# Patient Record
Sex: Male | Born: 1975 | Race: Black or African American | Hispanic: No | Smoking: Never smoker
Health system: Southern US, Community
[De-identification: ages and names within clinical notes are randomized; demographics above are authoritative.]

## PROBLEM LIST (undated history)

## (undated) DIAGNOSIS — R7303 Prediabetes: Secondary | ICD-10-CM

## (undated) HISTORY — PX: DENTAL SURGERY: SHX609

---

## 2007-10-19 ENCOUNTER — Ambulatory Visit: Payer: Self-pay | Admitting: General Practice

## 2009-03-24 IMAGING — CR DG CHEST 2V
1 series · 2 of 2 positions shown · non-contrast
Comparison: none

REASON FOR EXAM: 2 views [REDACTED] - FAX [phone]-Occupational
Health
COMMENTS:

PROCEDURE:     DXR - DXR CHEST PA (OR AP) AND LATERAL  - October 19, 2007  [DATE]
RESULT:     The lung fields are clear. The heart, mediastinal and osseous
structures show no acute changes. Incidental note is made of a slight
thoracolumbar scoliosis.

[Series 1: view not recorded · 0.17mm/px · 2 of 2 slices shown]
[im 1/2]
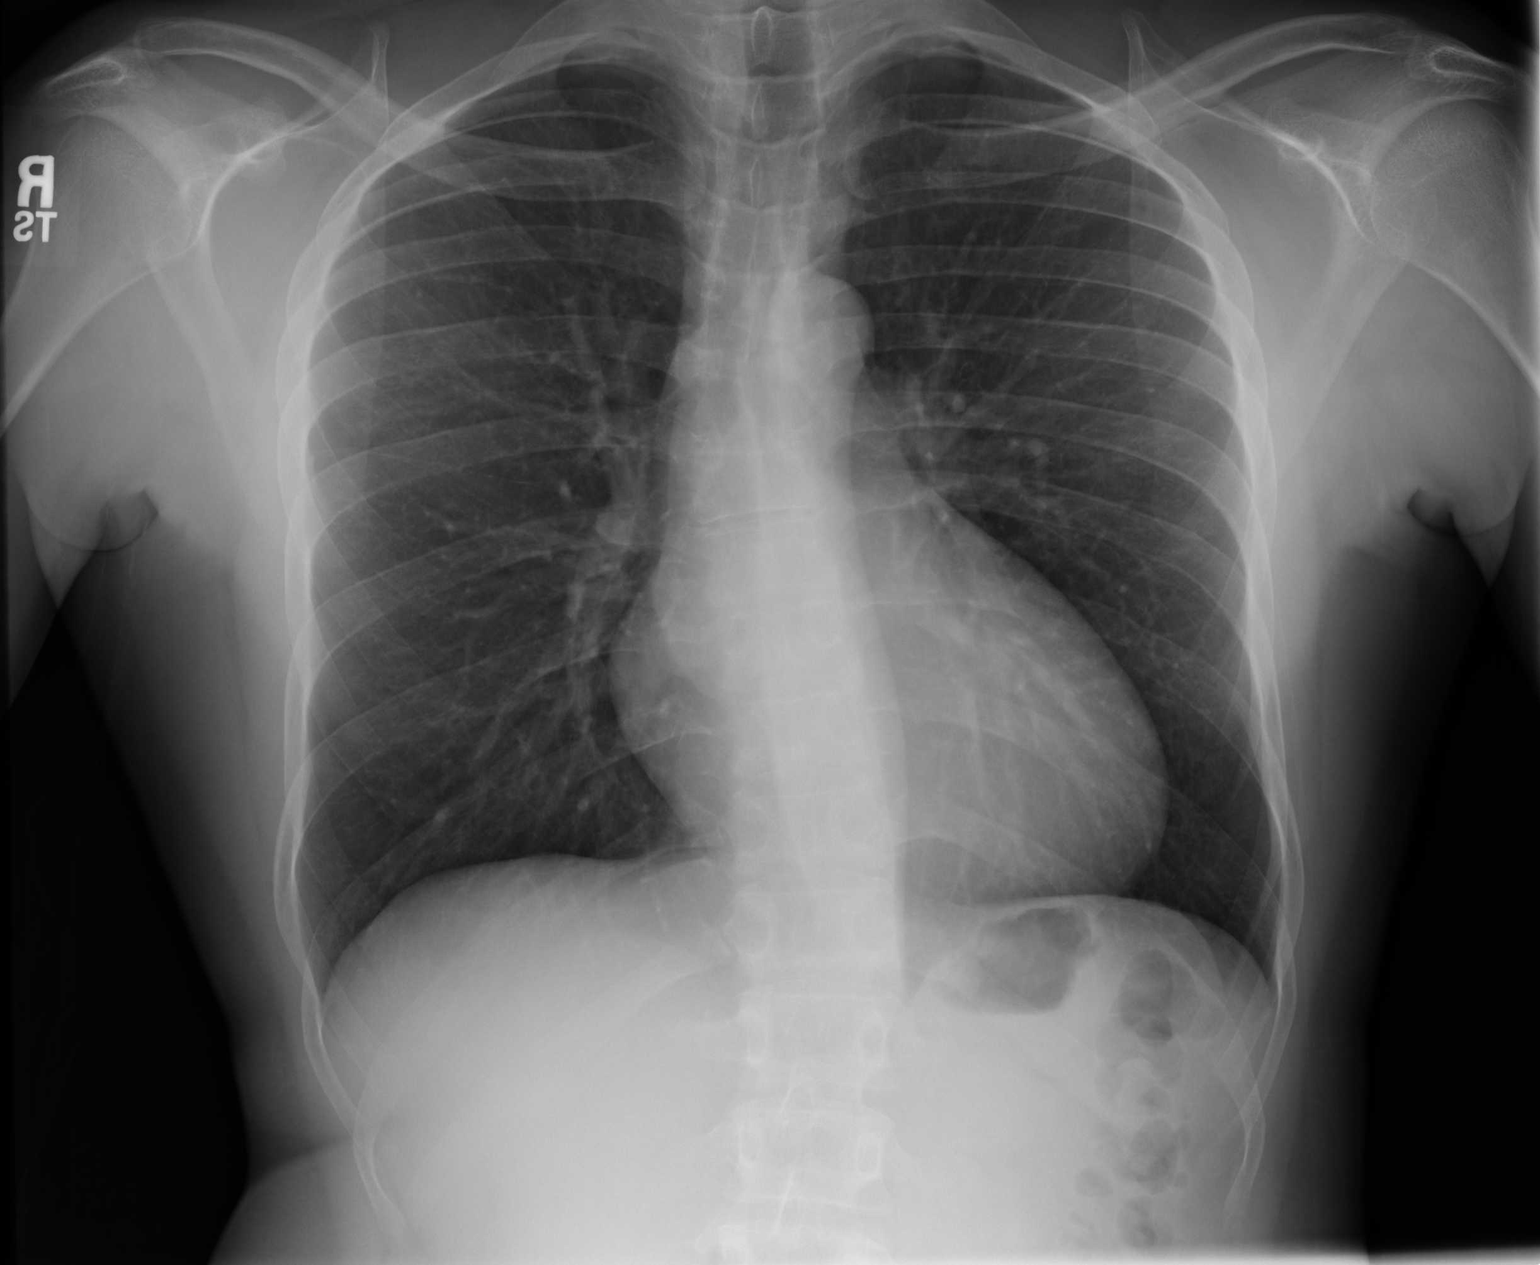
[im 2/2]
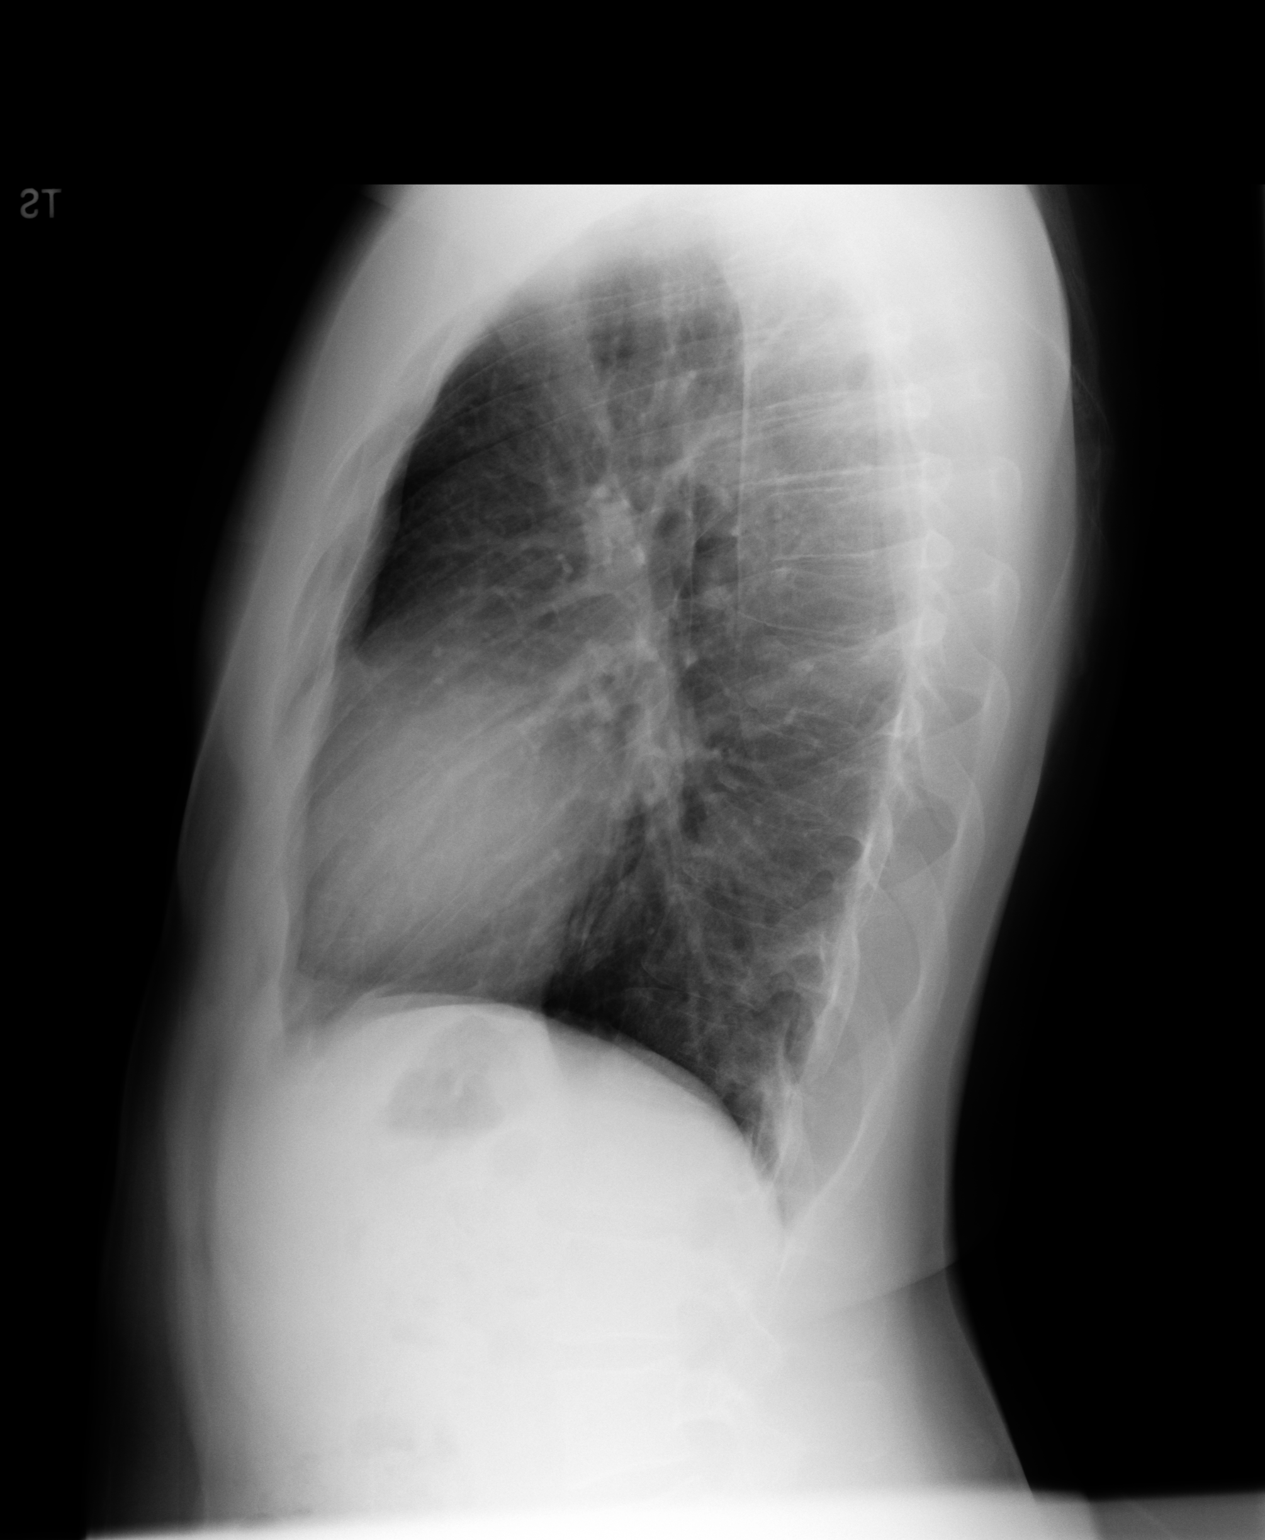

[2 of 2 positions shown; findings below may reference images not displayed]

IMPRESSION: 1. The lung fields are clear.
2. There is a slight thoracolumbar scoliosis.

## 2011-05-01 ENCOUNTER — Observation Stay: Payer: Self-pay | Admitting: Student

## 2011-05-24 ENCOUNTER — Ambulatory Visit: Payer: Self-pay | Admitting: Family Medicine

## 2011-06-18 ENCOUNTER — Ambulatory Visit: Payer: Self-pay | Admitting: Family Medicine

## 2011-07-19 ENCOUNTER — Ambulatory Visit: Payer: Self-pay | Admitting: Family Medicine

## 2020-09-03 ENCOUNTER — Encounter: Payer: Self-pay | Admitting: Emergency Medicine

## 2020-09-03 ENCOUNTER — Other Ambulatory Visit: Payer: Self-pay

## 2020-09-03 ENCOUNTER — Ambulatory Visit
Admission: EM | Admit: 2020-09-03 | Discharge: 2020-09-03 | Disposition: A | Discharge status: Other Acute Inpatient Hospital | Payer: Self-pay | Attending: Sports Medicine | Admitting: Sports Medicine

## 2020-09-03 DIAGNOSIS — IMO0002 Reserved for concepts with insufficient information to code with codable children: Secondary | ICD-10-CM

## 2020-09-03 DIAGNOSIS — R739 Hyperglycemia, unspecified: Secondary | ICD-10-CM | POA: Insufficient documentation

## 2020-09-03 DIAGNOSIS — E860 Dehydration: Secondary | ICD-10-CM | POA: Insufficient documentation

## 2020-09-03 DIAGNOSIS — R3589 Other polyuria: Secondary | ICD-10-CM | POA: Insufficient documentation

## 2020-09-03 DIAGNOSIS — R634 Abnormal weight loss: Secondary | ICD-10-CM | POA: Insufficient documentation

## 2020-09-03 DIAGNOSIS — E1065 Type 1 diabetes mellitus with hyperglycemia: Secondary | ICD-10-CM | POA: Insufficient documentation

## 2020-09-03 DIAGNOSIS — R631 Polydipsia: Secondary | ICD-10-CM | POA: Insufficient documentation

## 2020-09-03 HISTORY — DX: Prediabetes: R73.03

## 2020-09-03 LAB — URINALYSIS, COMPLETE (UACMP) WITH MICROSCOPIC
Bacteria, UA: NONE SEEN
Bilirubin Urine: NEGATIVE
Glucose, UA: >1000 mg/dL — AB
Ketones, ur: 80 mg/dL — AB
Leukocytes,Ua: NEGATIVE
Nitrite: NEGATIVE
Protein, ur: NEGATIVE mg/dL
Specific Gravity, Urine: 1.02 (ref 1.005–1.030)
Squamous Epithelial / LPF: NONE SEEN (ref 0–5)
pH: 5 (ref 5.0–8.0)

## 2020-09-03 LAB — GLUCOSE, CAPILLARY: Glucose-Capillary: 504 mg/dL (ref 70–99)

## 2020-09-03 NOTE — Discharge Instructions (Addendum)
Your blood sugar is 504.  This is consistent with diabetes.  You need to go to the emergency room.  Since her sister is driving, you do not need to go by EMS.  You will need IV insulin and IV fluids and correct any potential gap and acidosis that you have.  Please go directly to [facility] ED.  I have called and they are expecting you.

## 2020-09-03 NOTE — ED Triage Notes (Signed)
Patient in today c/o fatigue, dry mouth x 4 days. Patient states he has been told he was pre-diabetic. Patient states had cramping in his feet (L>R) Monday night (08/28/20). Patient went and got some Pedialyte and drank the whole bottle then cramping resolved. Patient states he is urinating more, but has been drinking a lot more too. Patient states even when he drinks a lot his mouth continues to be dry.

## 2020-09-03 NOTE — ED Triage Notes (Signed)
Patient is being discharged from the Urgent Care and sent to the Emergency Department via POV (with family) . Per Dr. Zachery Dauer, patient is in need of higher level of care due to elevated blood sugar. Patient is aware and verbalizes understanding of plan of care.  Vitals:   09/03/20 1347  BP: (!) 112/94  Pulse: 82  Resp: 18  Temp: 98.3 F (36.8 C)  SpO2: 96%

## 2020-09-07 NOTE — ED Provider Notes (Signed)
[facility]    CSN: [id number] Arrival date & time: 09/03/20  1311      History   Chief Complaint Chief Complaint  Patient presents with  . Fatigue  . dry mouth    HPI Martin Dudley is a 45 y.o. male.   Patient is a pleasant 45 year old male who presents for evaluation of the above issues.  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  On further history it appears as though he has been told that he had prediabetes years ago but never really had it worked up.  He reports that he lost his insurance for a while but recently was hired by [employer].  He reports 4 to 5 days of polyuria and polydipsia.  No nausea vomiting diarrhea.  No abdominal pain.  He also reports an 8 to 9 pound weight loss over the last week.  He tried a friend's home glucometer and it was not detectable.  He denies any chest pain or shortness of breath.  No red flag signs or symptoms elicited on history.     Past Medical History:  Diagnosis Date  . Pre-diabetes     There are no problems to display for this patient.   Past Surgical History:  Procedure Laterality Date  . DENTAL SURGERY         Home Medications    Prior to Admission medications   Medication Sig Start Date End Date Taking? Authorizing Provider  Cyanocobalamin (VITAMIN B-12 PO) Take 1 capsule by mouth daily.   Yes [provider]  VITAMIN D PO Take 1 tablet by mouth daily.   Yes [provider]    Family History Family History  Problem Relation Age of Onset  . Hypertension Mother   . Diabetes Father     Social History Social History   Tobacco Use  . Smoking status: Never Smoker  . Smokeless tobacco: Never Used  Vaping Use  . Vaping Use: Never used  Substance Use Topics  . Alcohol use: Yes    Comment: rare  . Drug use: Never     Allergies   Bee venom   Review of Systems Review of Systems  Constitutional: Negative for activity change, appetite change,  chills, diaphoresis, fatigue and fever.  HENT: Negative.  Negative for congestion.   Eyes: Negative.  Negative for pain and visual disturbance.  Respiratory: Negative.  Negative for cough, chest tightness and shortness of breath.   Cardiovascular: Negative.   Gastrointestinal: Negative.  Negative for abdominal pain, constipation, diarrhea, nausea and vomiting.  Endocrine: Positive for polydipsia and polyuria. Negative for polyphagia.  Genitourinary: Positive for frequency. Negative for dysuria, flank pain, hematuria and urgency.  Musculoskeletal: Negative.  Negative for back pain and myalgias.  Skin: Negative.  Negative for color change, rash and wound.  Neurological: Negative for dizziness, syncope, weakness, light-headedness, numbness and headaches.  All other systems reviewed and are negative.    Physical Exam Triage Vital Signs ED Triage Vitals  Enc Vitals Group     BP 09/03/20 1347 (!) 112/94     Pulse Rate 09/03/20 1347 82     Resp 09/03/20 1347 18     Temp 09/03/20 1347 98.3 F (36.8 C)     Temp Source 09/03/20 1347 Oral     SpO2 09/03/20 1347 96 %     Weight 09/03/20 1348 160 lb (72.6 kg)     Height 09/03/20 1348 5' 7.5" (1.715 m)  Head Circumference --      Peak Flow --      Pain Score 09/03/20 1348 0     Pain Loc --      Pain Edu? --      Excl. in GC? --    No data found.  Updated Vital Signs BP (!) 112/94 (BP Location: Left Arm)   Pulse 82   Temp 98.3 F (36.8 C) (Oral)   Resp 18   Ht 5' 7.5" (1.715 m)   Wt 72.6 kg   SpO2 96%   BMI 24.69 kg/m   Visual Acuity Right Eye Distance:   Left Eye Distance:   Bilateral Distance:    Right Eye Near:   Left Eye Near:    Bilateral Near:     Physical Exam Vitals and nursing note reviewed.  Constitutional:      General: He is not in acute distress.    Appearance: Normal appearance. He is not ill-appearing, toxic-appearing or diaphoretic.  HENT:     Head: Normocephalic and atraumatic.     Nose: No  congestion or rhinorrhea.     Mouth/Throat:     Mouth: Mucous membranes are dry.     Pharynx: Oropharynx is clear.  Cardiovascular:     Rate and Rhythm: Normal rate and regular rhythm.     Pulses: Normal pulses.     Heart sounds: Normal heart sounds. No murmur heard. No friction rub. No gallop.   Pulmonary:     Effort: Pulmonary effort is normal. No respiratory distress.     Breath sounds: Normal breath sounds. No stridor. No wheezing, rhonchi or rales.  Chest:     Chest wall: No tenderness.  Abdominal:     General: Abdomen is flat. Bowel sounds are normal. There is no distension.     Palpations: Abdomen is soft. There is no mass.     Tenderness: There is no abdominal tenderness. There is no right CVA tenderness, left CVA tenderness, guarding or rebound.  Musculoskeletal:     Cervical back: Normal range of motion and neck supple.  Skin:    General: Skin is warm.     Capillary Refill: Capillary refill takes less than 2 seconds.     Findings: No erythema, lesion or rash.  Neurological:     General: No focal deficit present.     Mental Status: He is alert.      UC Treatments / Results  Labs (all labs ordered are listed, but only abnormal results are displayed) Labs Reviewed  URINALYSIS, COMPLETE (UACMP) WITH MICROSCOPIC - Abnormal; Notable for the following components:      Result Value   Color, Urine STRAW (*)    Glucose, UA >1,000 (*)    Hgb urine dipstick TRACE (*)    Ketones, ur 80 (*)    All other components within normal limits  GLUCOSE, CAPILLARY - Abnormal; Notable for the following components:   Glucose-Capillary 504 (*)    All other components within normal limits  CBG MONITORING, ED    EKG   Radiology No results found.  Procedures Procedures (including critical care time)  Medications Ordered in UC Medications - No data to display  Initial Impression / Assessment and Plan / UC Course  I have reviewed the triage vital signs and the nursing  notes.  Pertinent labs & imaging results that were available during my care of the patient were reviewed by me and considered in my medical decision making (see chart for details).  Clinical impression: 4 to 5 days of polyuria and polydipsia without polyphagia.  Patient also has 8 to 9 pounds of weight loss over the last week.  Exam is reassuring.  Very concerning for new onset type 1 diabetes.  Treatment plan: 1.  The findings and treatment plan were discussed in detail with the patient.  Patient was in agreement. 2.  We did obtain a UA.  It did show greater than 1000 glucose with 80 ketones.  Negative for UTI. 3.  Obtained a chronic care glucose which came back at 504 critically elevated. 4.  I recommended transfer to the hospital for further evaluation.  Patient wanted to go to [facility] instead of [facility].  His sister was in the waiting room and came back and agreed to take him there.  He was not going to drive himself so no need for EMS to be dispatch. 5.  Patient was clinically stable upon discharge and will present to [facility].  Report was called to the charge nurse.    Final Clinical Impressions(s) / UC Diagnoses   Final diagnoses:  Elevated blood sugar  Polyuria  Polydipsia  Recent weight loss  Dehydration     Discharge Instructions     Your blood sugar is 504.  This is consistent with diabetes.  You need to go to the emergency room.  Since her sister is driving, you do not need to go by EMS.  You will need IV insulin and IV fluids and correct any potential gap and acidosis that you have.  Please go directly to [facility] ED.  I have called and expect in for you.   ED Prescriptions    None     PDMP not reviewed this encounter.   Delton See, MD 09/08/20 [phone]
